# Patient Record
Sex: Male | Born: 1990 | Race: White | Hispanic: No | Marital: Single | Smoking: Former smoker
Health system: Southern US, Community
[De-identification: ages and names within clinical notes are randomized; demographics above are authoritative.]

## PROBLEM LIST (undated history)

## (undated) DIAGNOSIS — K219 Gastro-esophageal reflux disease without esophagitis: Secondary | ICD-10-CM

---
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
## 2011-02-04 ENCOUNTER — Other Ambulatory Visit: Payer: Self-pay | Admitting: Family Medicine

## 2011-02-04 DIAGNOSIS — R591 Generalized enlarged lymph nodes: Secondary | ICD-10-CM

## 2011-02-06 ENCOUNTER — Other Ambulatory Visit: Payer: Self-pay

## 2011-09-02 ENCOUNTER — Other Ambulatory Visit: Payer: Self-pay | Admitting: Internal Medicine

## 2012-10-01 ENCOUNTER — Encounter (HOSPITAL_COMMUNITY): Payer: Self-pay | Admitting: Emergency Medicine

## 2012-10-01 ENCOUNTER — Emergency Department (HOSPITAL_COMMUNITY)
Admission: EM | Admit: 2012-10-01 | Discharge: 2012-10-02 | Disposition: A | Discharge status: Home / Self Care | Payer: BC Managed Care – PPO | Attending: Emergency Medicine | Admitting: Emergency Medicine

## 2012-10-01 DIAGNOSIS — F17200 Nicotine dependence, unspecified, uncomplicated: Secondary | ICD-10-CM | POA: Insufficient documentation

## 2012-10-01 DIAGNOSIS — R109 Unspecified abdominal pain: Secondary | ICD-10-CM | POA: Insufficient documentation

## 2012-10-01 NOTE — ED Notes (Signed)
Pt c/o R sided abd pain for several days. Denies fever, denies n/v/d.

## 2012-10-02 LAB — CBC WITH DIFFERENTIAL/PLATELET
Basophils Absolute: 0 10*3/uL (ref 0.0–0.1)
Eosinophils Absolute: 0.4 10*3/uL (ref 0.0–0.7)
Eosinophils Relative: 4 % (ref 0–5)
HCT: 45.1 % (ref 39.0–52.0)
Lymphocytes Relative: 27 % (ref 12–46)
Lymphs Abs: 2.8 10*3/uL (ref 0.7–4.0)
MCH: 30.6 pg (ref 26.0–34.0)
MCV: 85.6 fL (ref 78.0–100.0)
Monocytes Absolute: 1.1 10*3/uL — ABNORMAL HIGH (ref 0.1–1.0)
Platelets: 342 10*3/uL (ref 150–400)
RDW: 12.1 % (ref 11.5–15.5)
WBC: 10.2 10*3/uL (ref 4.0–10.5)

## 2012-10-02 LAB — URINALYSIS, ROUTINE W REFLEX MICROSCOPIC
Glucose, UA: NEGATIVE mg/dL
Hgb urine dipstick: NEGATIVE
Ketones, ur: NEGATIVE mg/dL
Protein, ur: NEGATIVE mg/dL
Urobilinogen, UA: 0.2 mg/dL (ref 0.0–1.0)

## 2012-10-02 LAB — BASIC METABOLIC PANEL
CO2: 26 mEq/L (ref 19–32)
Calcium: 9.6 mg/dL (ref 8.4–10.5)
Creatinine, Ser: 0.95 mg/dL (ref 0.50–1.35)
GFR calc non Af Amer: >90 mL/min (ref >90)
Glucose, Bld: 87 mg/dL (ref 70–99)
Sodium: 137 mEq/L (ref 135–145)

## 2012-10-02 NOTE — ED Provider Notes (Signed)
Medical screening examination/treatment/procedure(s) were performed by non-physician practitioner and as supervising physician I was immediately available for consultation/collaboration.  Olivia Mackie, MD 10/02/12 [phone]

## 2012-10-02 NOTE — ED Notes (Signed)
[id number]<AV> Expected date:<BR> Expected time:<BR> Means of arrival:<BR> Comments:<BR> EMS, 56 M, Post Seizure

## 2012-10-02 NOTE — ED Provider Notes (Signed)
History     CSN: [id number]  Arrival date & time 10/01/12  2346   First MD Initiated Contact with Patient 10/02/12 0005      Chief Complaint  Patient presents with  . Abdominal Pain   HPI  History provided by the patient and friend. Patient is a 22 year old male with no significant PMH who presents with complaints of right-sided abdominal pain. Patient was recently traveling in a road trip from [place]. He has had pain and symptoms for approximately 4-5 days. Pain is only on the right side radiates some to the hip and flank. He described as a squeezing and occasional sharp crampy pain. Severe pains may last several minutes at times and then resolve. This evening upon returning he had significant sharp pains they were at the worst he has had. His symptoms are improving at this time. He denies any other associated symptoms. No fever, chills or sweats. No nausea vomiting. No diarrhea or constipation. Denies any dysuria or hematuria. No other aggravating or alleviating factors.    History reviewed. No pertinent past medical history.  History reviewed. No pertinent past surgical history.  No family history on file.  History  Substance Use Topics  . Smoking status: Current Every Day Smoker  . Smokeless tobacco: Not on file  . Alcohol Use: Yes     Comment: daily      Review of Systems  Constitutional: Negative for fever, chills and diaphoresis.  Respiratory: Negative for shortness of breath.   Cardiovascular: Negative for chest pain.  Gastrointestinal: Positive for abdominal pain. Negative for nausea, vomiting, diarrhea, constipation and blood in stool.  Genitourinary: Negative for dysuria, frequency, hematuria and flank pain.  All other systems reviewed and are negative.    Allergies  Review of patient's allergies indicates no known allergies.  Home Medications  No current outpatient prescriptions on file.  BP 142/74  Pulse 92  Temp(Src) 98.6 F (37 C) (Oral)  Resp 18  Ht  5\' 9"  (1.753 m)  Wt 200 lb (90.719 kg)  BMI 29.52 kg/m2  SpO2 100%  Physical Exam  Nursing note and vitals reviewed. Constitutional: He is oriented to person, place, and time. He appears well-developed and well-nourished. No distress.  HENT:  Head: Normocephalic.  Cardiovascular: Normal rate and regular rhythm.   Pulmonary/Chest: Effort normal and breath sounds normal. No respiratory distress. He has no wheezes. He has no rales.  Abdominal: Soft. Bowel sounds are normal. He exhibits no distension. There is no hepatosplenomegaly. There is tenderness in the right lower quadrant. There is no rigidity, no rebound, no guarding, no CVA tenderness and negative Murphy's sign.  Neurological: He is alert and oriented to person, place, and time.  Skin: Skin is warm.  Psychiatric: He has a normal mood and affect.    ED Course  Procedures   Results for orders placed during the hospital encounter of 10/01/12  CBC WITH DIFFERENTIAL      Result Value Range   WBC 10.2  4.0 - 10.5 K/uL   RBC 5.27  4.22 - 5.81 MIL/uL   Hemoglobin 16.1  13.0 - 17.0 g/dL   HCT 40.9  81.1 - 91.4 %   MCV 85.6  78.0 - 100.0 fL   MCH 30.6  26.0 - 34.0 pg   MCHC 35.7  30.0 - 36.0 g/dL   RDW 78.2  95.6 - 21.3 %   Platelets 342  150 - 400 K/uL   Neutrophils Relative % 58  43 - 77 %  Neutro Abs 5.9  1.7 - 7.7 K/uL   Lymphocytes Relative 27  12 - 46 %   Lymphs Abs 2.8  0.7 - 4.0 K/uL   Monocytes Relative 10  3 - 12 %   Monocytes Absolute 1.1 (*) 0.1 - 1.0 K/uL   Eosinophils Relative 4  0 - 5 %   Eosinophils Absolute 0.4  0.0 - 0.7 K/uL   Basophils Relative 0  0 - 1 %   Basophils Absolute 0.0  0.0 - 0.1 K/uL  BASIC METABOLIC PANEL      Result Value Range   Sodium 137  135 - 145 mEq/L   Potassium 3.3 (*) 3.5 - 5.1 mEq/L   Chloride 99  96 - 112 mEq/L   CO2 26  19 - 32 mEq/L   Glucose, Bld 87  70 - 99 mg/dL   BUN 10  6 - 23 mg/dL   Creatinine, Ser 9.56  0.50 - 1.35 mg/dL   Calcium 9.6  8.4 - 21.3 mg/dL   GFR calc  non Af Amer >90  >90 mL/min   GFR calc Af Amer >90  >90 mL/min  URINALYSIS, ROUTINE W REFLEX MICROSCOPIC      Result Value Range   Color, Urine YELLOW  YELLOW   APPearance CLEAR  CLEAR   Specific Gravity, Urine 1.028  1.005 - 1.030   pH 5.5  5.0 - 8.0   Glucose, UA NEGATIVE  NEGATIVE mg/dL   Hgb urine dipstick NEGATIVE  NEGATIVE   Bilirubin Urine NEGATIVE  NEGATIVE   Ketones, ur NEGATIVE  NEGATIVE mg/dL   Protein, ur NEGATIVE  NEGATIVE mg/dL   Urobilinogen, UA 0.2  0.0 - 1.0 mg/dL   Nitrite NEGATIVE  NEGATIVE   Leukocytes, UA NEGATIVE  NEGATIVE       1. Abdominal pain       MDM  12:30AM patient seen and evaluated. Patient appears well nontoxic. Does not appear in significant discomfort. He has mild tenderness. No peritoneal signs. We'll obtain laboratory testing and decide on further evaluation.  Patient continues to be well without significant discomfort. UA is unremarkable. Normal WBC. At this time I discussed options for further testing including considering a possible CAT scan to evaluate the appendix further given his right lower cautery pain. He does not wish to have this performed at this time. The risks, benefits and alternatives were discussed. He expresses understanding and feels comfortable returning home. He was given strict return precautions and advised to have a recheck of symptoms in the next 24-48 hours.      Angus Seller, PA-C 10/02/12 [phone]

## 2013-04-14 ENCOUNTER — Emergency Department: Payer: Self-pay | Admitting: Emergency Medicine

## 2013-04-14 LAB — CK TOTAL AND CKMB (NOT AT ARMC): CK, Total: 97 U/L (ref 35–232)

## 2013-04-14 LAB — DRUG SCREEN, URINE
MDMA (Ecstasy)Ur Screen: NEGATIVE (ref ?–500)
Methadone, Ur Screen: NEGATIVE (ref ?–300)
Opiate, Ur Screen: NEGATIVE (ref ?–300)
Phencyclidine (PCP) Ur S: NEGATIVE (ref ?–25)

## 2013-04-14 LAB — CBC
HCT: 44.7 % (ref 40.0–52.0)
HGB: 15.7 g/dL (ref 13.0–18.0)
MCH: 30.7 pg (ref 26.0–34.0)
MCHC: 35.1 g/dL (ref 32.0–36.0)
Platelet: 346 10*3/uL (ref 150–440)
RBC: 5.11 10*6/uL (ref 4.40–5.90)
RDW: 12.8 % (ref 11.5–14.5)
WBC: 13.1 10*3/uL — ABNORMAL HIGH (ref 3.8–10.6)

## 2013-04-14 LAB — BASIC METABOLIC PANEL
BUN: 11 mg/dL (ref 7–18)
Calcium, Total: 9.8 mg/dL (ref 8.5–10.1)
Co2: 24 mmol/L (ref 21–32)
EGFR (African American): >60
Sodium: 139 mmol/L (ref 136–145)

## 2013-04-14 LAB — TROPONIN I: Troponin-I: <0.02 ng/mL

## 2016-12-31 ENCOUNTER — Ambulatory Visit (HOSPITAL_COMMUNITY)
Admission: EM | Admit: 2016-12-31 | Discharge: 2016-12-31 | Disposition: A | Discharge status: Home / Self Care | Payer: BC Managed Care – PPO | Attending: Family Medicine | Admitting: Family Medicine

## 2016-12-31 ENCOUNTER — Encounter (HOSPITAL_COMMUNITY): Payer: Self-pay | Admitting: *Deleted

## 2016-12-31 DIAGNOSIS — J358 Other chronic diseases of tonsils and adenoids: Secondary | ICD-10-CM | POA: Insufficient documentation

## 2016-12-31 DIAGNOSIS — Z79899 Other long term (current) drug therapy: Secondary | ICD-10-CM | POA: Diagnosis not present

## 2016-12-31 DIAGNOSIS — J029 Acute pharyngitis, unspecified: Secondary | ICD-10-CM | POA: Insufficient documentation

## 2016-12-31 DIAGNOSIS — F17200 Nicotine dependence, unspecified, uncomplicated: Secondary | ICD-10-CM | POA: Insufficient documentation

## 2016-12-31 LAB — POCT RAPID STREP A: Streptococcus, Group A Screen (Direct): NEGATIVE

## 2016-12-31 MED ORDER — PHENOL 1.4 % MT LIQD: 1.0000 | OROMUCOSAL | 0 refills | Status: AC | PRN

## 2016-12-31 MED ORDER — CETIRIZINE-PSEUDOEPHEDRINE ER 5-120 MG PO TB12: 1.0000 | ORAL_TABLET | Freq: Every day | ORAL | 0 refills | Status: AC

## 2016-12-31 MED ORDER — FLUTICASONE PROPIONATE 50 MCG/ACT NA SUSP: 2.0000 | Freq: Every day | NASAL | 0 refills | Status: AC

## 2016-12-31 NOTE — ED Triage Notes (Signed)
Patient states he noticed right side of throat was swollen today, states it looks like I have infection. No fevers. States he feels fine.

## 2016-12-31 NOTE — Discharge Instructions (Signed)
Rapid strep negative. Symptoms are most likely due to viral illness. Flonase and/or Zyrtec-D for nasal congestion. You can use over the counter nasal saline rinse such as neti pot for nasal congestion. Follow up with PCP for further evaluation and referrals needed. Monitor for any worsening of symptoms, swelling of the throat, trouble breathing, trouble swallowing, follow up for reevaluation.

## 2016-12-31 NOTE — ED Provider Notes (Signed)
[facility]    CSN: [id number] Arrival date & time: 12/31/16  1926     History   Chief Complaint Chief Complaint  Patient presents with  . Sore Throat    HPI Ryan Higgins is a 26 y.o. male.   26 year old male comes in for 1 day history of right swollen tonsils with irritation. He states he has post nasal drip constantly prior to symptoms, and has been coughing daily for years. Denies ear pain, eye pain. Denies fever, chills, night sweats. Denies abdominal pain, nausea, vomiting, diarrhea, constipation. He has occasional sneezing, and thinks he could have seasonal allergies. He has not tried anything for the nasal congestion/post nasal drip. He states he also regularly gets tonsil stones.        History reviewed. No pertinent past medical history.  There are no active problems to display for this patient.   History reviewed. No pertinent surgical history.     Home Medications    Prior to Admission medications   Medication Sig Start Date End Date Taking? Authorizing Provider  cetirizine-pseudoephedrine (ZYRTEC-D) 5-120 MG tablet Take 1 tablet by mouth daily. 12/31/16   Cathie Hoops, Amy V, PA-C  fluticasone (FLONASE) 50 MCG/ACT nasal spray Place 2 sprays into both nostrils daily. 12/31/16   Cathie Hoops, Amy V, PA-C  phenol (CHLORASEPTIC) 1.4 % LIQD Use as directed 1 spray in the mouth or throat as needed for throat irritation / pain. 12/31/16   Belinda Fisher, PA-C    Family History History reviewed. No pertinent family history.  Social History Social History  Substance Use Topics  . Smoking status: Current Some Day Smoker  . Smokeless tobacco: Never Used  . Alcohol use Yes     Comment: daily     Allergies   Patient has no known allergies.   Review of Systems Review of Systems  Reason unable to perform ROS: See HPI as above.     Physical Exam Triage Vital Signs ED Triage Vitals [12/31/16 1939]  Enc Vitals Group     BP 132/72     Pulse Rate 74     Resp 16   Temp 98.1 F (36.7 C)     Temp Source Oral     SpO2 99 %     Weight      Height      Head Circumference      Peak Flow      Pain Score      Pain Loc      Pain Edu?      Excl. in GC?    No data found.   Updated Vital Signs BP 132/72 (BP Location: Left Arm)   Pulse 74   Temp 98.1 F (36.7 C) (Oral)   Resp 16   SpO2 99%    Physical Exam  Constitutional: He is oriented to person, place, and time. He appears well-developed and well-nourished. No distress.  HENT:  Head: Normocephalic and atraumatic.  Right Ear: Tympanic membrane, external ear and ear canal normal. Tympanic membrane is not erythematous and not bulging.  Left Ear: Tympanic membrane, external ear and ear canal normal. Tympanic membrane is not erythematous and not bulging.  Nose: Nose normal. Right sinus exhibits no maxillary sinus tenderness and no frontal sinus tenderness. Left sinus exhibits no maxillary sinus tenderness and no frontal sinus tenderness.  Mouth/Throat: Uvula is midline and mucous membranes are normal. Posterior oropharyngeal erythema present. Tonsils are 2+ on the right. Tonsils are 2+ on the left.  Eyes:  Pupils are equal, round, and reactive to light. Conjunctivae are normal.  Neck: Normal range of motion. Neck supple.  Cardiovascular: Normal rate, regular rhythm and normal heart sounds.  Exam reveals no gallop and no friction rub.   No murmur heard. Pulmonary/Chest: Effort normal and breath sounds normal. He has no decreased breath sounds. He has no wheezes. He has no rhonchi. He has no rales.  Lymphadenopathy:    He has no cervical adenopathy.  Neurological: He is alert and oriented to person, place, and time.  Skin: Skin is warm and dry.  Psychiatric: He has a normal mood and affect. His behavior is normal. Judgment normal.     UC Treatments / Results  Labs (all labs ordered are listed, but only abnormal results are displayed) Labs Reviewed  CULTURE, GROUP A STREP [facility])  POCT RAPID  STREP A    EKG  EKG Interpretation None       Radiology No results found.  Procedures Procedures (including critical care time)  Medications Ordered in UC Medications - No data to display   Initial Impression / Assessment and Plan / UC Course  I have reviewed the triage vital signs and the nursing notes.  Pertinent labs & imaging results that were available during my care of the patient were reviewed by me and considered in my medical decision making (see chart for details).     Rapid strep negative. Symptomatic treatment as needed. Return precautions given.    Final Clinical Impressions(s) / UC Diagnoses   Final diagnoses:  Pharyngitis, unspecified etiology    New Prescriptions Discharge Medication List as of 12/31/2016  8:20 PM    START taking these medications   Details  cetirizine-pseudoephedrine (ZYRTEC-D) 5-120 MG tablet Take 1 tablet by mouth daily., Starting Wed 12/31/2016, Normal    fluticasone (FLONASE) 50 MCG/ACT nasal spray Place 2 sprays into both nostrils daily., Starting Wed 12/31/2016, Normal           Belinda FisherYu, Amy V, [place]PA-C 12/31/16 2041

## 2017-01-03 LAB — CULTURE, GROUP A STREP (THRC)

## 2017-02-26 ENCOUNTER — Emergency Department (HOSPITAL_COMMUNITY): Payer: BC Managed Care – PPO

## 2017-02-26 ENCOUNTER — Emergency Department (HOSPITAL_COMMUNITY)
Admission: EM | Admit: 2017-02-26 | Discharge: 2017-02-26 | Disposition: A | Discharge status: Home / Self Care | Payer: BC Managed Care – PPO | Attending: Emergency Medicine | Admitting: Emergency Medicine

## 2017-02-26 ENCOUNTER — Encounter (HOSPITAL_COMMUNITY): Payer: Self-pay | Admitting: Nurse Practitioner

## 2017-02-26 DIAGNOSIS — Z87891 Personal history of nicotine dependence: Secondary | ICD-10-CM | POA: Diagnosis not present

## 2017-02-26 DIAGNOSIS — R51 Headache: Secondary | ICD-10-CM | POA: Insufficient documentation

## 2017-02-26 DIAGNOSIS — G44209 Tension-type headache, unspecified, not intractable: Secondary | ICD-10-CM

## 2017-02-26 DIAGNOSIS — Z79899 Other long term (current) drug therapy: Secondary | ICD-10-CM | POA: Diagnosis not present

## 2017-02-26 HISTORY — DX: Gastro-esophageal reflux disease without esophagitis: K21.9

## 2017-02-26 MED ORDER — IOPAMIDOL (ISOVUE-370) INJECTION 76%
INTRAVENOUS | Status: AC
  Administered 2017-02-26: 18:00:00
  Filled 2017-02-26: qty 100

## 2017-02-26 MED ORDER — IOPAMIDOL (ISOVUE-370) INJECTION 76%
100.0000 mL | Freq: Once | INTRAVENOUS | Status: AC | PRN
  Administered 2017-02-26: 100 mL via INTRAVENOUS

## 2017-02-26 NOTE — ED Triage Notes (Signed)
Patient started having a sharp pain on the left side of his forehead. He was concerned and wanted to get it checked out

## 2017-02-26 NOTE — ED Provider Notes (Signed)
[facility]-EMERGENCY DEPT Provider Note   CSN: [id number] Arrival date & time: 02/26/17  1636     History   Chief Complaint No chief complaint on file.   HPI Lamar BenesChristopher Callow is a 26 y.o. male.  Patient is a 26 year old male who presents with a headache.  He states that he was playing his saxophone and had a sudden onset of pain in his left temporal area.  He is described as a sharp stabbing pain.  He states it is eased off but he still having some intermittent stabbing pains to his left temporal region.  He denies any vision changes.  He states he got a little lightheaded with the pain.  No facial numbness.  No speech deficits.  No numbness or weakness to his extremities.  No associated neck pain.  He has not taken anything for the symptoms.  He has no family history of aneurysmal disease.      Past Medical History:  Diagnosis Date  . GERD (gastroesophageal reflux disease)     There are no active problems to display for this patient.   History reviewed. No pertinent surgical history.     Home Medications    Prior to Admission medications   Medication Sig Start Date End Date Taking? Authorizing Provider  omeprazole (PRILOSEC) 20 MG capsule Take 20 mg by mouth 2 (two) times daily before a meal.   Yes [provider]  cetirizine-pseudoephedrine (ZYRTEC-D) 5-120 MG tablet Take 1 tablet by mouth daily. Patient not taking: Reported on 02/26/2017 12/31/16   Belinda FisherYu, Amy V, PA-C  fluticasone [facility](FLONASE) 50 MCG/ACT nasal spray Place 2 sprays into both nostrils daily. Patient not taking: Reported on 02/26/2017 12/31/16   Belinda FisherYu, Amy V, PA-C  phenol (CHLORASEPTIC) 1.4 % LIQD Use as directed 1 spray in the mouth or throat as needed for throat irritation / pain. Patient not taking: Reported on 02/26/2017 12/31/16   Lurline IdolYu, Amy V, PA-C    Family History History reviewed. No pertinent family history.  Social History Social History  Substance Use Topics  . Smoking  status: Former Games developermoker  . Smokeless tobacco: Never Used  . Alcohol use 2.4 oz/week    4 Cans of beer per week     Comment: daily     Allergies   Patient has no known allergies.   Review of Systems Review of Systems  Constitutional: Negative for chills, diaphoresis, fatigue and fever.  HENT: Negative for congestion, rhinorrhea and sneezing.   Eyes: Negative.   Respiratory: Negative for cough, chest tightness and shortness of breath.   Cardiovascular: Negative for chest pain and leg swelling.  Gastrointestinal: Negative for abdominal pain, blood in stool, diarrhea, nausea and vomiting.  Genitourinary: Negative for difficulty urinating, flank pain, frequency and hematuria.  Musculoskeletal: Negative for arthralgias and back pain.  Skin: Negative for rash.  Neurological: Positive for light-headedness and headaches. Negative for dizziness, speech difficulty, weakness and numbness.     Physical Exam Updated Vital Signs BP (!) 149/80 (BP Location: Right Arm)   Pulse 78   Temp 98.5 F (36.9 C) (Oral)   Resp 16   Ht 5\' 11"  (1.803 m)   Wt 85.3 kg (188 lb)   SpO2 100%   BMI 26.22 kg/m   Physical Exam  Constitutional: He is oriented to person, place, and time. He appears well-developed and well-nourished.  HENT:  Head: Normocephalic and atraumatic.  No pain over the temporal artery  Eyes: Pupils are equal, round, and reactive to  light.  Neck: Normal range of motion. Neck supple.  No meningismus  Cardiovascular: Normal rate, regular rhythm and normal heart sounds.   Pulmonary/Chest: Effort normal and breath sounds normal. No respiratory distress. He has no wheezes. He has no rales. He exhibits no tenderness.  Abdominal: Soft. Bowel sounds are normal. There is no tenderness. There is no rebound and no guarding.  Musculoskeletal: Normal range of motion. He exhibits no edema.  Lymphadenopathy:    He has no cervical adenopathy.  Neurological: He is alert and oriented to person,  place, and time.  Motor 5/5 all extremities Sensation grossly intact to LT all extremities Finger to Nose intact, no pronator drift CN II-XII grossly intact Gait normal   Skin: Skin is warm and dry. No rash noted.  Psychiatric: He has a normal mood and affect.     ED Treatments / Results  Labs (all labs ordered are listed, but only abnormal results are displayed) Labs Reviewed - No data to display  EKG  EKG Interpretation None       Radiology Ct Angio Head W/cm &/or Wo Cm  Result Date: 02/26/2017 CLINICAL DATA:  26 y/o  M; severe left-sided headache. EXAM: CT ANGIOGRAPHY HEAD TECHNIQUE: Multidetector CT imaging of the head was performed using the standard protocol during bolus administration of intravenous contrast. Multiplanar CT image reconstructions and MIPs were obtained to evaluate the vascular anatomy. CONTRAST:  100 cc Isovue 370 COMPARISON:  None. FINDINGS: CT HEAD Brain: No evidence of acute infarction, hemorrhage, hydrocephalus, extra-axial collection or mass lesion/mass effect. Vascular: No hyperdense vessel or unexpected calcification. Skull: Normal. Negative for fracture or focal lesion. Sinuses: Imaged portions are clear. Orbits: No acute finding. CTA HEAD Anterior circulation: No significant stenosis, proximal occlusion, aneurysm, or vascular malformation. Posterior circulation: No significant stenosis, proximal occlusion, aneurysm, or vascular malformation. Venous sinuses: As permitted by contrast timing, patent. Anatomic variants: Patent anterior communicating artery and bilateral posterior communicating arteries. Delayed phase: No abnormal intracranial enhancement. IMPRESSION: 1. Normal CT of the head. 2. Normal CTA of the head. Electronically Signed   By: Mitzi Hansen M.D.   On: 02/26/2017 19:18    Procedures Procedures (including critical care time)  Medications Ordered in ED Medications  iopamidol (ISOVUE-370) 76 % injection (  Contrast Given  02/26/17 1820)  iopamidol (ISOVUE-370) 76 % injection 100 mL (100 mLs Intravenous Contrast Given 02/26/17 1823)     Initial Impression / Assessment and Plan / ED Course  I have reviewed the triage vital signs and the nursing notes.  Pertinent labs & imaging results that were available during my care of the patient were reviewed by me and considered in my medical decision making (see chart for details).     Patient presents with pain to his left forehead after playing the saxophone.  He was concerned with the sudden onset and intensity that it may be an aneurysm.  CT scan shows no evidence of aneurysm or intracranial hemorrhage.  He has improving symptoms.  He only has a very mild headache currently.  He has no neck stiffness, nausea, vomiting or any neurologic deficits.  I do not feel that LP is indicated.  He was discharged home in good condition.  He was encouraged to follow-up with a PCP if his symptoms continue.  Return precautions were given.  Final Clinical Impressions(s) / ED Diagnoses   Final diagnoses:  Acute non intractable tension-type headache    New Prescriptions New Prescriptions   No medications on file  Rolan Bucco, MD 02/26/17 [phone]

## 2018-01-05 IMAGING — CT CT ANGIO HEAD
1 of 11 series · 5 of 33 positions shown · IV contrast (ISOVUE 370)
Comparison: None.

CLINICAL DATA: 26 y/o  M; severe left-sided headache.

EXAM:
CT ANGIOGRAPHY HEAD
TECHNIQUE: Multidetector CT imaging of the head was performed using the
standard protocol during bolus administration of intravenous
contrast. Multiplanar CT image reconstructions and MIPs were
obtained to evaluate the vascular anatomy.
CONTRAST:  100 cc Isovue 370

[Series 12: axial · axial · 0.39mm/px · z∈[-138,-43]mm · 5 of 145 slices shown]
[im 25/145  soft-tissue]
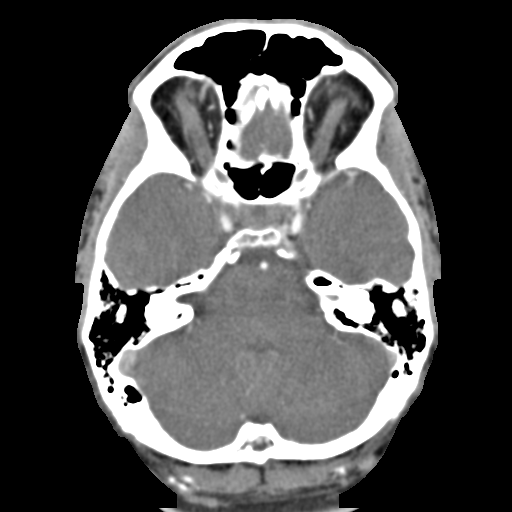
[im 49/145  bone]
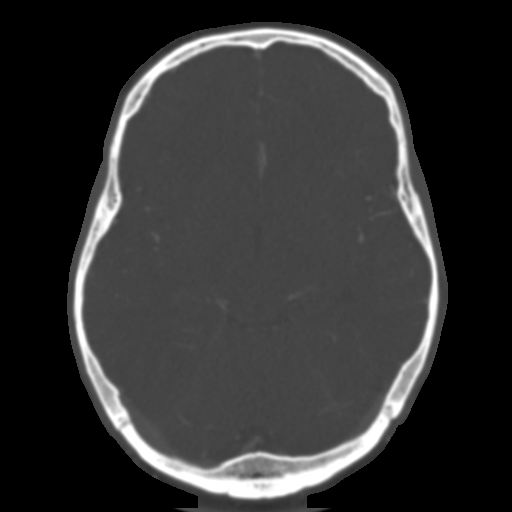
[im 73/145  soft-tissue]
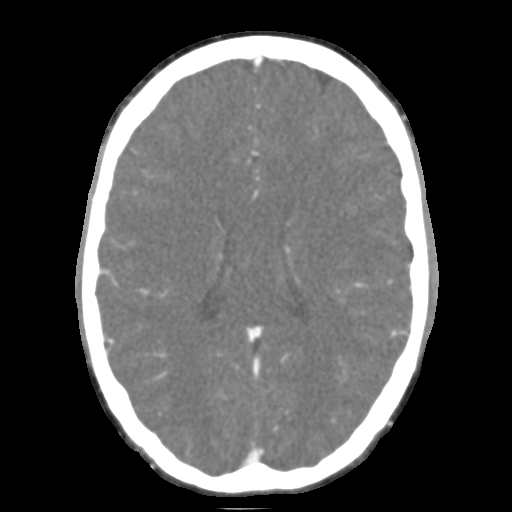
[im 97/145  bone]
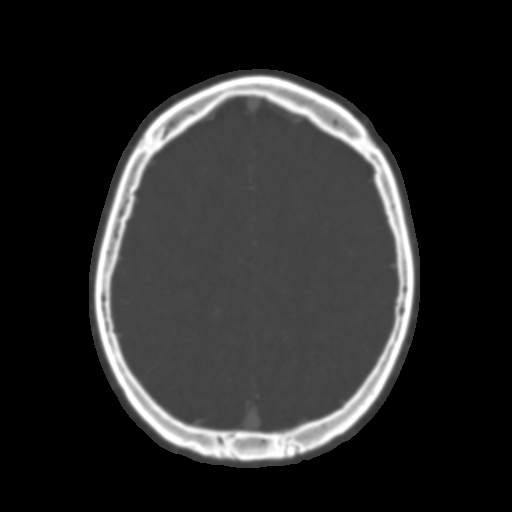
[im 121/145  soft-tissue]
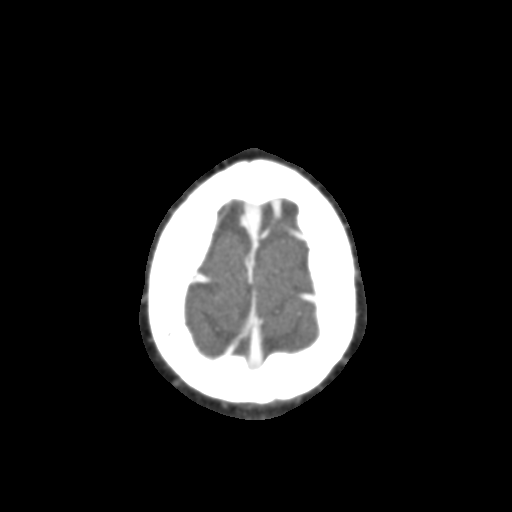

[5 of 33 positions shown; findings below may reference images not displayed]

FINDINGS: CT HEAD

Brain: No evidence of acute infarction, hemorrhage, hydrocephalus,
extra-axial collection or mass lesion/mass effect.

Vascular: No hyperdense vessel or unexpected calcification.

Skull: Normal. Negative for fracture or focal lesion.

Sinuses: Imaged portions are clear.

Orbits: No acute finding.

CTA HEAD

Anterior circulation: No significant stenosis, proximal occlusion,
aneurysm, or vascular malformation.

Posterior circulation: No significant stenosis, proximal occlusion,
aneurysm, or vascular malformation.

Venous sinuses: As permitted by contrast timing, patent.

Anatomic variants: Patent anterior communicating artery and
bilateral posterior communicating arteries.

Delayed phase: No abnormal intracranial enhancement.
IMPRESSION: 1. Normal CT of the head.
2. Normal CTA of the head.

By: Taqueria Schwarz M.D.
